# Patient Record
Sex: Female | Born: 1935
Health system: Southern US, Community
[De-identification: ages and names within clinical notes are randomized; demographics above are authoritative.]

## PROBLEM LIST (undated history)

## (undated) DIAGNOSIS — C801 Malignant (primary) neoplasm, unspecified: Secondary | ICD-10-CM

---

## 2000-05-16 HISTORY — PX: BREAST EXCISIONAL BIOPSY: SUR124

## 2004-07-05 ENCOUNTER — Ambulatory Visit: Payer: Self-pay | Admitting: Internal Medicine

## 2005-07-14 ENCOUNTER — Ambulatory Visit: Payer: Self-pay | Admitting: Internal Medicine

## 2006-02-12 ENCOUNTER — Emergency Department: Payer: Self-pay | Admitting: Unknown Physician Specialty

## 2006-07-20 ENCOUNTER — Ambulatory Visit: Payer: Self-pay | Admitting: Internal Medicine

## 2007-08-08 ENCOUNTER — Ambulatory Visit: Payer: Self-pay | Admitting: Internal Medicine

## 2007-09-12 ENCOUNTER — Ambulatory Visit: Payer: Self-pay | Admitting: Internal Medicine

## 2008-09-16 ENCOUNTER — Ambulatory Visit: Payer: Self-pay | Admitting: Internal Medicine

## 2008-11-25 ENCOUNTER — Inpatient Hospital Stay: Payer: Self-pay | Admitting: Internal Medicine

## 2008-12-10 ENCOUNTER — Ambulatory Visit: Payer: Self-pay | Admitting: Urology

## 2008-12-12 ENCOUNTER — Ambulatory Visit: Payer: Self-pay | Admitting: Urology

## 2008-12-17 ENCOUNTER — Ambulatory Visit: Payer: Self-pay | Admitting: Urology

## 2008-12-24 ENCOUNTER — Ambulatory Visit: Payer: Self-pay | Admitting: Urology

## 2009-07-07 ENCOUNTER — Ambulatory Visit: Payer: Self-pay | Admitting: Urology

## 2009-09-29 ENCOUNTER — Ambulatory Visit: Payer: Self-pay | Admitting: Internal Medicine

## 2010-01-03 ENCOUNTER — Emergency Department: Payer: Self-pay | Admitting: Emergency Medicine

## 2010-11-30 ENCOUNTER — Ambulatory Visit: Payer: Self-pay | Admitting: Internal Medicine

## 2010-12-01 ENCOUNTER — Ambulatory Visit: Payer: Self-pay | Admitting: Internal Medicine

## 2011-12-01 ENCOUNTER — Ambulatory Visit: Payer: Self-pay | Admitting: Internal Medicine

## 2012-12-03 ENCOUNTER — Ambulatory Visit: Payer: Self-pay | Admitting: Internal Medicine

## 2014-01-06 ENCOUNTER — Ambulatory Visit: Payer: Self-pay | Admitting: Family Medicine

## 2015-01-29 ENCOUNTER — Other Ambulatory Visit: Payer: Self-pay | Admitting: Family Medicine

## 2015-01-29 DIAGNOSIS — Z1231 Encounter for screening mammogram for malignant neoplasm of breast: Secondary | ICD-10-CM

## 2015-02-03 ENCOUNTER — Other Ambulatory Visit: Payer: Self-pay | Admitting: Family Medicine

## 2015-02-03 ENCOUNTER — Ambulatory Visit
Admission: RE | Admit: 2015-02-03 | Discharge: 2015-02-03 | Disposition: A | Discharge status: Home / Self Care | Payer: Medicare PPO | Source: Ambulatory Visit | Attending: Family Medicine | Admitting: Family Medicine

## 2015-02-03 DIAGNOSIS — Z1231 Encounter for screening mammogram for malignant neoplasm of breast: Secondary | ICD-10-CM

## 2016-03-08 ENCOUNTER — Other Ambulatory Visit: Payer: Self-pay | Admitting: Family Medicine

## 2016-03-08 DIAGNOSIS — Z1231 Encounter for screening mammogram for malignant neoplasm of breast: Secondary | ICD-10-CM

## 2016-03-17 ENCOUNTER — Other Ambulatory Visit: Payer: Self-pay | Admitting: Family Medicine

## 2016-03-17 ENCOUNTER — Ambulatory Visit
Admission: RE | Admit: 2016-03-17 | Discharge: 2016-03-17 | Disposition: A | Discharge status: Home / Self Care | Payer: Medicare PPO | Source: Ambulatory Visit | Attending: Family Medicine | Admitting: Family Medicine

## 2016-03-17 DIAGNOSIS — Z1231 Encounter for screening mammogram for malignant neoplasm of breast: Secondary | ICD-10-CM | POA: Insufficient documentation

## 2017-02-14 ENCOUNTER — Other Ambulatory Visit: Payer: Self-pay | Admitting: Family Medicine

## 2017-02-14 DIAGNOSIS — Z1231 Encounter for screening mammogram for malignant neoplasm of breast: Secondary | ICD-10-CM

## 2017-03-20 ENCOUNTER — Ambulatory Visit
Admission: RE | Admit: 2017-03-20 | Discharge: 2017-03-20 | Disposition: A | Discharge status: Home / Self Care | Payer: Medicare HMO | Source: Ambulatory Visit | Attending: Family Medicine | Admitting: Family Medicine

## 2017-03-20 DIAGNOSIS — Z1231 Encounter for screening mammogram for malignant neoplasm of breast: Secondary | ICD-10-CM | POA: Insufficient documentation

## 2017-03-20 HISTORY — DX: Malignant (primary) neoplasm, unspecified: C80.1

## 2018-02-12 ENCOUNTER — Other Ambulatory Visit: Payer: Self-pay | Admitting: Family Medicine

## 2018-02-12 DIAGNOSIS — Z1231 Encounter for screening mammogram for malignant neoplasm of breast: Secondary | ICD-10-CM

## 2018-03-27 ENCOUNTER — Ambulatory Visit
Admission: RE | Admit: 2018-03-27 | Discharge: 2018-03-27 | Disposition: A | Discharge status: Home / Self Care | Payer: Medicare HMO | Source: Ambulatory Visit | Attending: Family Medicine | Admitting: Family Medicine

## 2018-03-27 DIAGNOSIS — Z1231 Encounter for screening mammogram for malignant neoplasm of breast: Secondary | ICD-10-CM | POA: Insufficient documentation

## 2019-12-27 IMAGING — MG DIGITAL SCREENING BILATERAL MAMMOGRAM WITH TOMO AND CAD
8 series · 9 of 24 positions shown · non-contrast
Comparison: Previous exam(s).

CLINICAL DATA: Screening.

EXAM:
DIGITAL SCREENING BILATERAL MAMMOGRAM WITH TOMO AND CAD

[R MLO synth-2D]
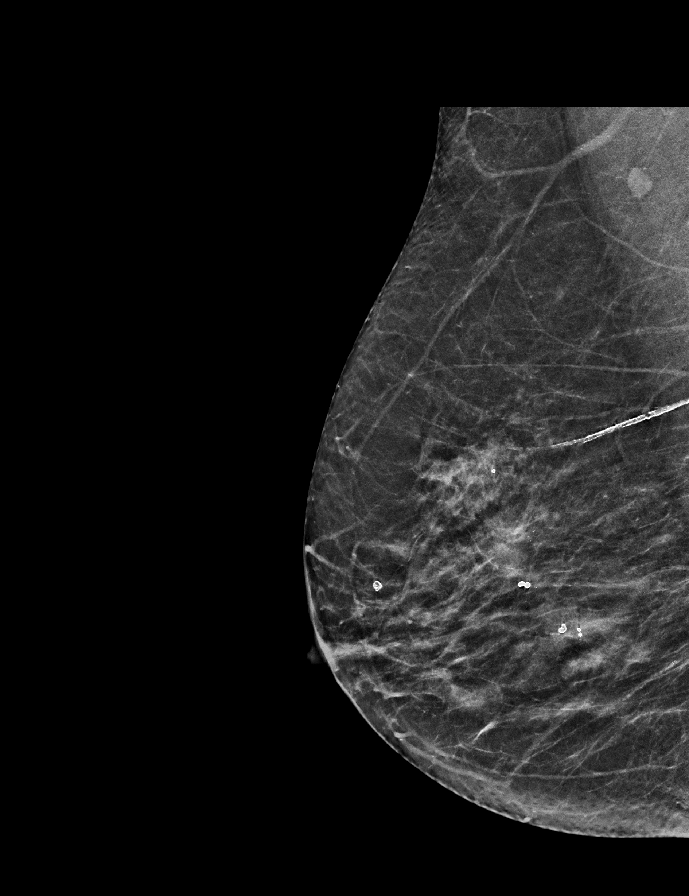

[L CC synth-2D]
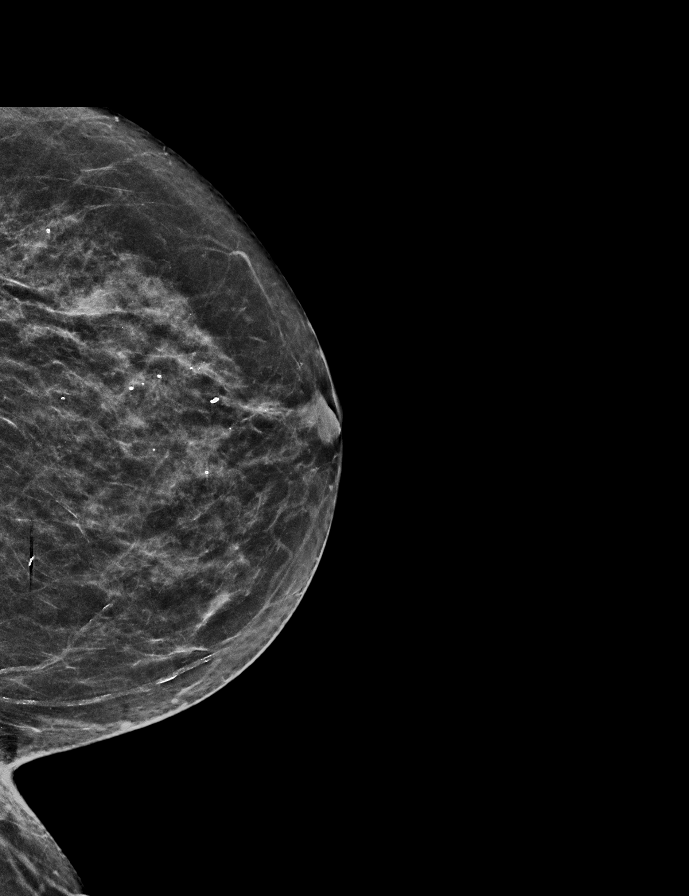

[R CC synth-2D]
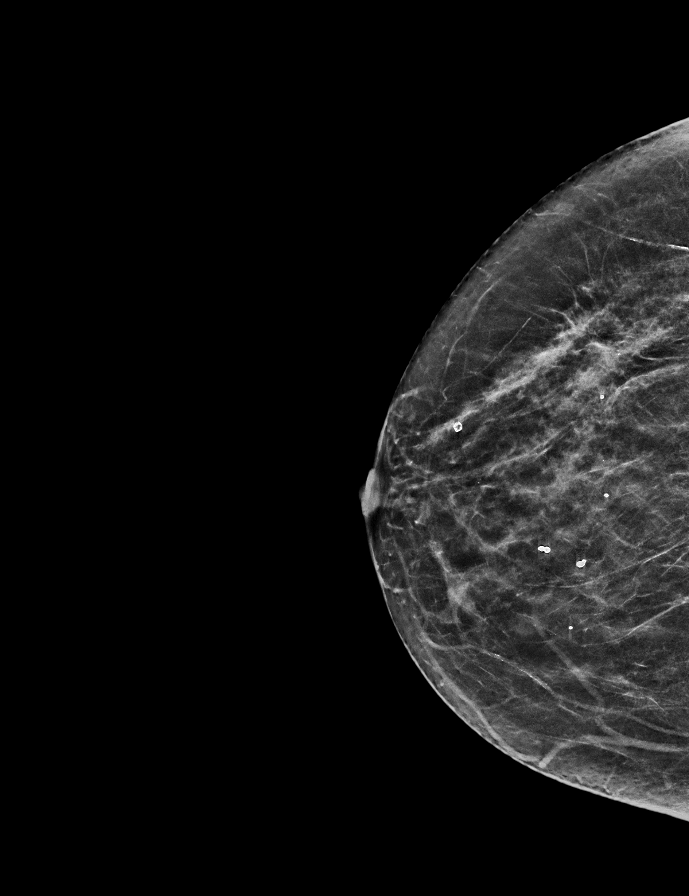

[L MLO synth-2D]
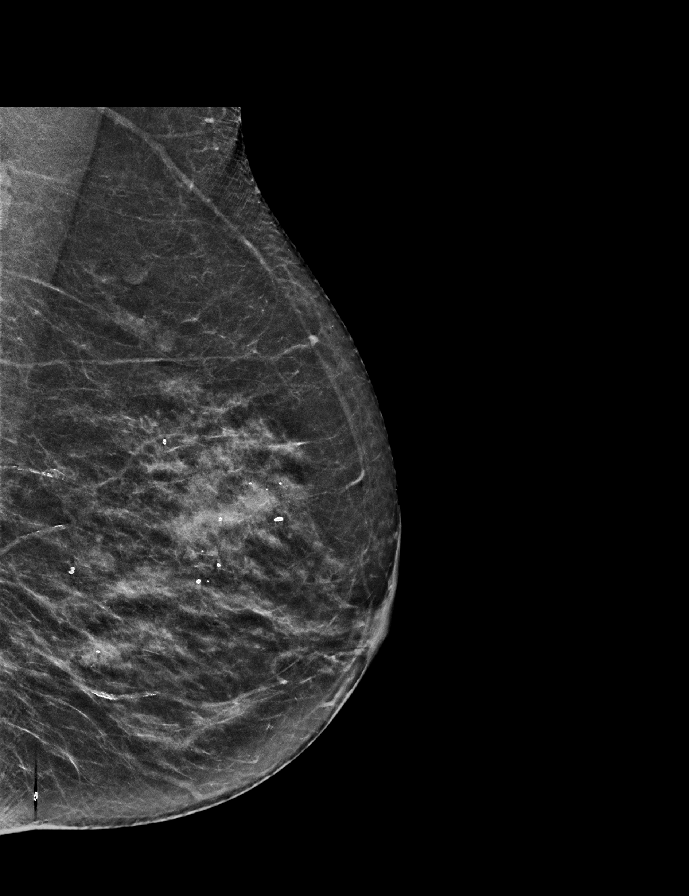

[L CC tomo · 2 of 51 frames shown]
[frame 17/51]
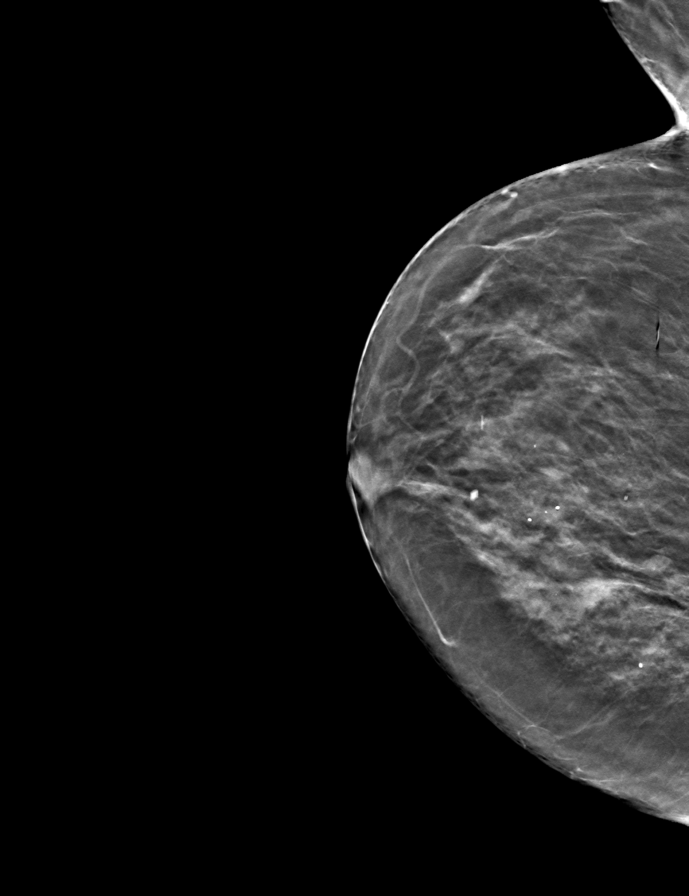
[frame 26/51]
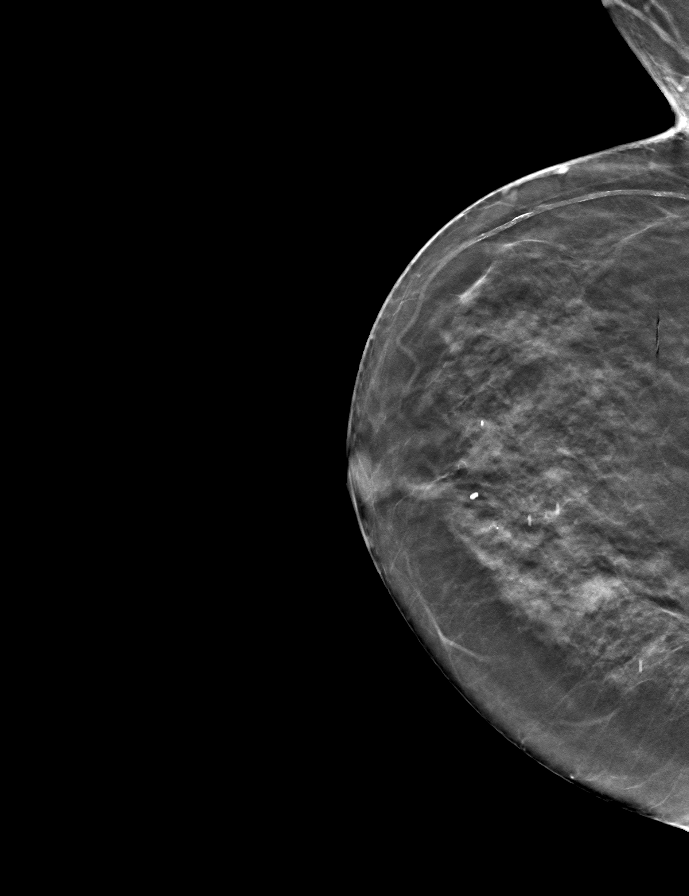

[L MLO tomo · tomo slice 27/52.0]
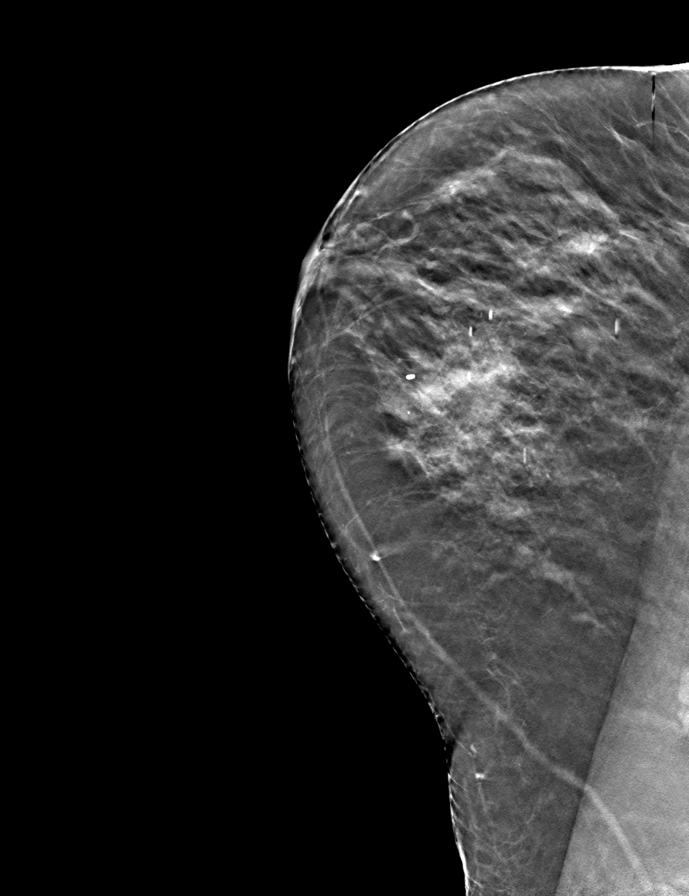

[R CC tomo · tomo slice 25/50.0]
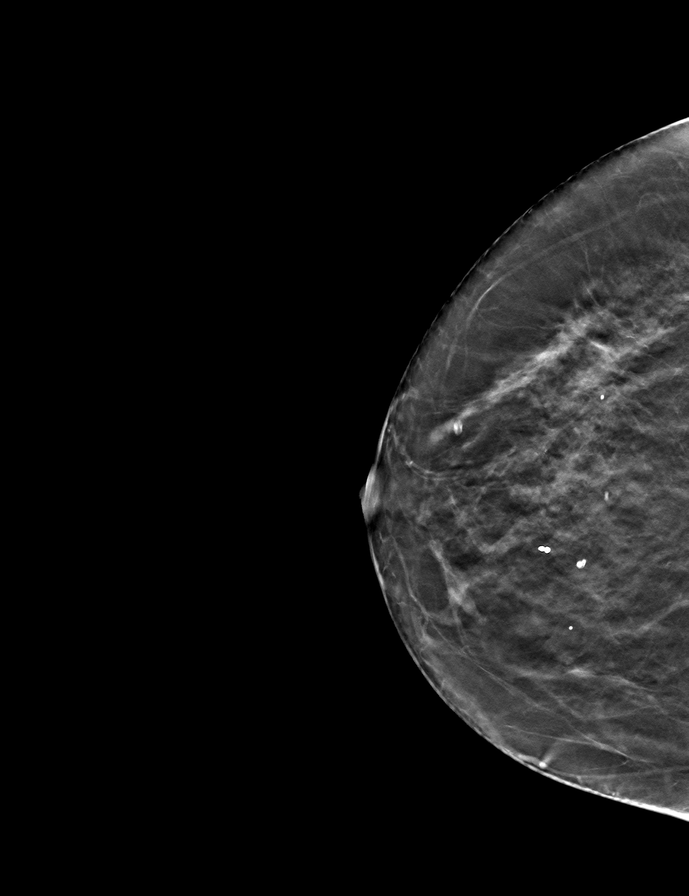

[R MLO tomo · tomo slice 26/51.0]
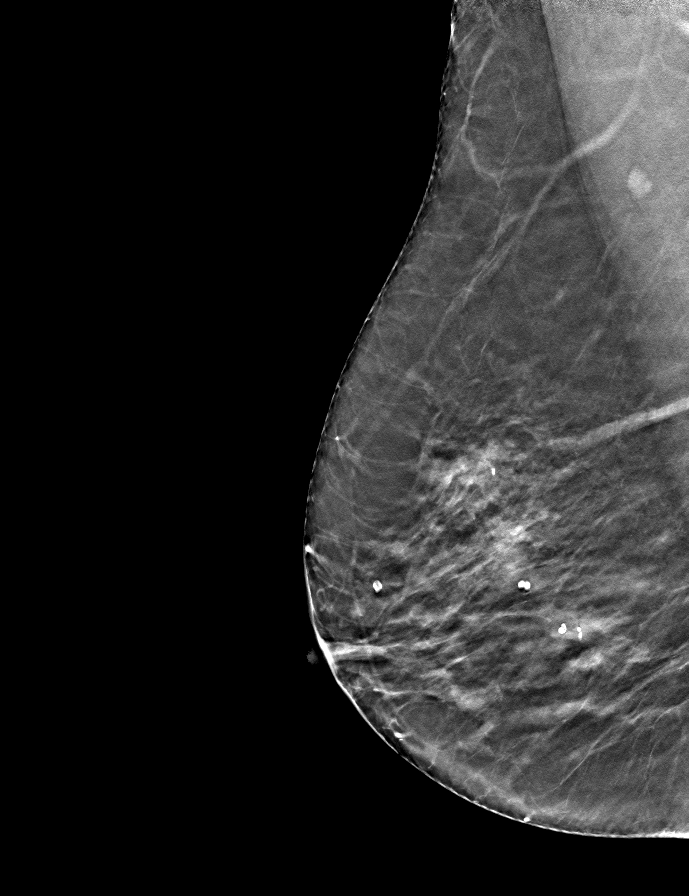

[9 of 24 positions shown; findings below may reference images not displayed]

ACR Breast Density Category c: The breast tissue is heterogeneously
dense, which may obscure small masses.
FINDINGS: There are no findings suspicious for malignancy. Images were
processed with CAD.
IMPRESSION: No mammographic evidence of malignancy. A result letter of this
screening mammogram will be mailed directly to the patient.

RECOMMENDATION:
Screening mammogram in one year. (Code:[id number])

BI-RADS CATEGORY  1: Negative.

## 2020-01-27 ENCOUNTER — Ambulatory Visit: Payer: Medicare HMO | Attending: Internal Medicine

## 2020-01-27 ENCOUNTER — Ambulatory Visit: Payer: Medicare HMO

## 2020-01-27 DIAGNOSIS — Z23 Encounter for immunization: Secondary | ICD-10-CM

## 2020-01-27 NOTE — Progress Notes (Signed)
   Covid-19 Vaccination Clinic  Name:  Bridget Ward    MRN: [id number] DOB: December 19, 1934  01/27/2020  Bridget Ward was observed post Covid-19 immunization for 15 minutes without incident. She was provided with Vaccine Information Sheet and instruction to access the V-Safe system.   Bridget Ward was instructed to call 911 with any severe reactions post vaccine: Marland Kitchen Difficulty breathing  . Swelling of face and throat  . A fast heartbeat  . A bad rash all over body  . Dizziness and weakness

## 2022-10-25 ENCOUNTER — Other Ambulatory Visit: Payer: Self-pay

## 2022-10-25 DIAGNOSIS — Z515 Encounter for palliative care: Secondary | ICD-10-CM

## 2022-10-27 NOTE — Progress Notes (Signed)
TELEPHONE ENCOUNTER  PC SW connected with patients spouse, to discuss and review new PC referral. Spouse shared that he is interested in scheduling an initial consultation visit to discuss patient needs further and possibility acquiring more help in the home as he is primary caregiver and patient has advanced dementia.   Plan: In home pc visit scheduled for 10/2022.

## 2022-11-03 ENCOUNTER — Other Ambulatory Visit: Payer: Self-pay

## 2022-11-03 VITALS — BP 104/74 | HR 73 | Temp 97.9°F

## 2022-11-03 DIAGNOSIS — Z515 Encounter for palliative care: Secondary | ICD-10-CM

## 2022-11-03 NOTE — Progress Notes (Signed)
COMMUNITY PALLIATIVE CARE SW NOTE  PATIENT NAME: Bridget Ward DOB: August 08, 1934 MRN: [id number]  PRIMARY CARE PROVIDER: Kandyce Rud, MD  RESPONSIBLE PARTY:  Acct ID - Guarantor Home Phone Work Phone Relationship Acct Type  [id number] [facility]* [id number]  Self P/F     [address], [place], [place] [address]     PLAN OF CARE and INTERVENTIONS:              GOALS OF CARE/ ADVANCE CARE PLANNING: Goals include to maximize quality of life.                                                       Code Status: FULL CODE                               ACD: living will and advance directives in place, however spouse states he wants to review them again.                              GOC: increase quality of life and avoid hospitalization and life prolonging measures.  2.        SOCIAL/EMOTIONAL/SPIRITUAL ASSESSMENT/ INTERVENTIONS:         Palliative care encounter: SW and RN completed initial in home visit with patient and patient spouse. PC criteria reviewed and discussed.   Presenting problem: 88 y.o female with senile dementia, late onset Alzheimer's dementia, hypertension and multiple other comorbidities. New palliative care referral from PCP to address long term planning, symptom management, and goals of care.   Cognition/dementia: tangential and flighty. Patient engages in short conversation with incongruent answers at times. Patient presented irritated with palliative care team presence. Spouse shares that patient is resistant with care and is sundowning.  Mobility: patient is ambulatory without AD. No falls reported. Patient requires assistance with bathing. And able to feedself.  Nutrition: spouse endorses that patient eats well.   palliative care services: Criteria and services reviewed..   Psychosocial assessment: completed.   In home support: patient lives with spouse. Spouse endorses that he is interested in in home support for patient. He has done some research  already. SW discussed private pay agencies and caregivers. Patient had the name of a caregiver, Velna Hatchet. SW Paulette Blanch for spouse to have her outreach him with patient needs. PC also discussed in home providers as it has become more difficult for spouse to get patient out of the home to doctor appointments. Referral sent to [facility].   Transportation: no needs.  Food: no food insecurities witnessed.   Safety and long term planning: patient feels safe in her home and desires to remain in her  home. SW discussed with spouse having a daily "check in" routine with family and/or rriends to create a safety plan in the event spouse health fails or something should happen to him due to him breing the primary caregiver for patient.  Resources; dementia resource guide book left in the home.  SW discussed goals, reviewed care plan, provided emotional support, used active and reflective listening in the form of reciprocity emotional response. Questions and concerns were addressed. The patient was encouraged to call with any additional questions and/or concerns. PC Provided general support and  encouragement, no other unmet needs identified.    PC to f/u in 3-4 weeks.   3.         PATIENT/CAREGIVER EDUCATION/ COPING:   Appearance: Frail and thin in appearance. Disheveled appearance with dirty clothing on Mental Status: Alert and oriented to self Eye Contact: good  Thought Process: irrational  Thought Content: irrational  Speech: clear Mood: agitated Affect: Congruent to endorsed mood, full ranging Insight: poor Judgement: poor Interaction Style: semi hostile   Patient A&O and able to make basic needs known.                                              Management of Memory Problems   1)  Establish a routine. ? Try to establish and then stick to a regular routine.  By doing this, you will get used to what to expect and you will reduce the need to rely on your memory.  Also, try to do  things at the same time of day, such as taking your medication or checking your calendar first thing in the morning. ? Think about think that you can do as a part of a regular routine and make a list.  Then enter them into a daily planner to remind you.  This will help you establish a routine.   2)  Organize your environment. ? Organize your environment so that it is uncluttered.  Decrease visual stimulation.  Place everyday items such as keys or cell phone in the same place every day (ie.  Basket next to front door) ? Use post it notes with a brief message to yourself (ie. Turn off light, lock the door) ? Use labels to indicate where things go (ie. Which cupboards are for food, dishes, etc.) ? Keep a notepad and pen by the telephone to take messages   3)  Memory Aids ? A diary or journal/notebook/daily planner ? Making a list (shopping list, chore list, to do list that needs to be done) ? Using an alarm as a reminder (kitchen timer or cell phone alarm) ? Using cell phone to store information (Notes, Calendar, Reminders) ? Calendar/White board placed in a prominent position ? Post-it notes   In order for memory aids to be useful, you need to have good habits.  It's no good remembering to make a note in your journal if you don't remember to look in it.  Try setting aside a certain time of day to look in journal.   4)  Improving mood and managing fatigue. ? There may be other factors that contribute to memory difficulties.  Factors, such as anxiety, depression and tiredness can affect memory.  Regular gentle exercise can help improve your mood and give you more energy.  Simple relaxation techniques may help relieve symptoms of anxiety  Try to get back to completing activities or hobbies you enjoyed doing in the past.  Learn to pace yourself through activities to decrease fatigue.  Find out about some local support groups where you can share experiences with others.  Try and achieve 7-8  hours of sleep at night.  Caregiver support: disease progression and role of PC discussed with family. Spouse shares that he meditates every morning and has caregiver support group information.   4.         PERSONAL EMERGENCY PLAN:  Patient/family will call 9-1-1 for emergencies.  5.         COMMUNITY RESOURCES COORDINATION/ HEALTH CARE NAVIGATION: none   6.      FINANCIAL CONCERNS/NEEDS: None                         Primary Health Insurance: Medicare Secondary Health Insurance: NONE     SOCIAL HX:  Social History   Tobacco Use   Smoking status: Not on file   Smokeless tobacco: Not on file  Substance Use Topics   Alcohol use: Not on file         [place], [place]

## 2022-11-03 NOTE — Progress Notes (Signed)
PATIENT NAME: Bridget Ward DOB: 10/13/1934 MRN: [id number]  PRIMARY CARE PROVIDER: Kandyce Rud, MD  RESPONSIBLE PARTY:  Acct ID - Guarantor Home Phone Work Phone Relationship Acct Type  [id number] [facility]* [phone]  Self P/F     [address] [place], [place] [address]   Home visit completed with patient and her spouse.  ACP:  Spouse advised they do have Advance Directives in place but he will need to review before he makes a decision regarding patient's code status.  Dementia (FAST Score 6B):  Frail and thin in appearance.  Disheveled appearance with dirty clothing on. Patient is refusing all care from her spouse.  He is uncertain when her last bath was.  Currently looking for in home support.  Has the name of a couple of private caregivers and will be contacting for in home support.  Patient will engage in conversation for very short intervals but becomes very agitated easily.  Naps frequently throughout the day. She will not take any medications due to refusal.  Spouse believes it has been about 1 1/2 years since patient took any medications.  Patient remains continent of bowel and bladder at this time.  Spouse advises patient does have some difficulty but seems to manage this aspect of her hygiene okay.  She remains ambulatory without assistive devices.  There is a component of hallucinations that is occurring.  Patient frequently states the "red people are getting busy".  Spouse states sundowning is an issue.  Patient wanders in the home during the evening, moves items and becomes more agitated.   Spouse is unable to take patient out of the home as patient has gotten out of the car and walked away from her spouse.  He is concerned for her safety if he they go out.  We discussed a home health provider who can see patient in the home and spouse is agreeable.  No skin breakdown and no recent infections noted by spouse.   Follow Up:  Will complete a follow up visit next month to  assess overall status and support in the home.  Will review ACP documents and further discuss code status on next month.  Monitor for hospice eligibility.   Resource:  Dementia Care Resource guide provided to spouse Lloyd Huger.    CODE STATUS: Full at this time ADVANCED DIRECTIVES: Yes-spouse to review MOST FORM: No PPS: 40%   PHYSICAL EXAM:   VITALS: Today's Vitals   11/03/22 1320  BP: 104/74  Pulse: 73  Temp: 97.9 F (36.6 C)  SpO2: 99%    LUNGS: clear to auscultation  CARDIAC: Cor RRR}  EXTREMITIES: - for edema SKIN: Skin color, texture, turgor normal. No rashes or lesions or mobility and turgor normal  NEURO: positive for memory problems       Truitt Merle, RN
